# Patient Record
Sex: Male | Born: 1985 | Race: Black or African American | Hispanic: No | Marital: Single | Smoking: Current every day smoker
Health system: Southern US, Community
[De-identification: ages and names within clinical notes are randomized; demographics above are authoritative.]

## PROBLEM LIST (undated history)

## (undated) HISTORY — PX: LEG SURGERY: SHX1003

## (undated) HISTORY — PX: FINGER SURGERY: SHX640

---

## 2017-02-22 ENCOUNTER — Encounter (HOSPITAL_COMMUNITY): Payer: Self-pay | Admitting: Emergency Medicine

## 2017-02-22 ENCOUNTER — Emergency Department (HOSPITAL_COMMUNITY): Payer: BLUE CROSS/BLUE SHIELD

## 2017-02-22 ENCOUNTER — Emergency Department (HOSPITAL_COMMUNITY)
Admission: EM | Admit: 2017-02-22 | Discharge: 2017-02-22 | Disposition: A | Discharge status: Home / Self Care | Payer: BLUE CROSS/BLUE SHIELD | Attending: Emergency Medicine | Admitting: Emergency Medicine

## 2017-02-22 ENCOUNTER — Other Ambulatory Visit: Payer: Self-pay

## 2017-02-22 DIAGNOSIS — Y929 Unspecified place or not applicable: Secondary | ICD-10-CM | POA: Insufficient documentation

## 2017-02-22 DIAGNOSIS — S62306A Unspecified fracture of fifth metacarpal bone, right hand, initial encounter for closed fracture: Secondary | ICD-10-CM | POA: Insufficient documentation

## 2017-02-22 DIAGNOSIS — Y999 Unspecified external cause status: Secondary | ICD-10-CM | POA: Diagnosis not present

## 2017-02-22 DIAGNOSIS — Y9301 Activity, walking, marching and hiking: Secondary | ICD-10-CM | POA: Diagnosis not present

## 2017-02-22 DIAGNOSIS — S6991XA Unspecified injury of right wrist, hand and finger(s), initial encounter: Secondary | ICD-10-CM | POA: Diagnosis present

## 2017-02-22 DIAGNOSIS — W000XXA Fall on same level due to ice and snow, initial encounter: Secondary | ICD-10-CM | POA: Diagnosis not present

## 2017-02-22 DIAGNOSIS — S62396A Other fracture of fifth metacarpal bone, right hand, initial encounter for closed fracture: Secondary | ICD-10-CM

## 2017-02-22 NOTE — Discharge Instructions (Signed)
Please see the information and instructions below regarding your visit.  Your diagnoses today include:  1. Other fracture of fifth metacarpal bone, right hand, initial encounter for closed fracture    You have a fracture in the bone just below your pinky finger.  This requires follow-up with a hand surgeon to evaluate you.  I spoke with Dr. Eulah PontMurphy today over the phone.  He would like to see you in the office this week.  Please keep your splint on until you see him.  Tests performed today include: See side panel of your discharge paperwork for testing performed today. Vital signs are listed at the bottom of these instructions.   X-rays of the hand.  Medications prescribed:    Take any prescribed medications only as prescribed, and any over the counter medications only as directed on the packaging.  Please alternate ibuprofen and Tylenol for the pain.  He may take ibuprofen every 6 hours and Tylenol every 6 hours.  Do not exceed 4 g of Tylenol 1 day.  Home care instructions:  Please follow any educational materials contained in this packet.   Keep the splint dry and covered if you are to shower.  Follow-up instructions:  Please follow up with Dr. Eulah PontMurphy in hand surgery for your fracture.  Return instructions:   Please return to the Emergency Department if you experience worsening symptoms.   Please return if you notice your fingers going numb or pale at the end of the splint, or you have an increase in swelling or pain.  Please return if you have any other emergent concerns.  Additional Information:   Your vital signs today were: BP (!) 148/89    Pulse 78    Temp 99 F (37.2 C)    Resp 18    SpO2 100%  If your blood pressure (BP) was elevated on multiple readings during this visit above 130 for the top number or above 80 for the bottom number, please have this repeated by your primary care provider within one month.  Your blood pressure is high today.  I would like you to  find a primary care provider regarding this.  It should be rechecked. --------------  Thank you for allowing us to participate in your care today.

## 2017-02-22 NOTE — ED Provider Notes (Signed)
[facility] Provider Note   CSN: [id number] Arrival date & time: 02/22/17  1057     History   Chief Complaint Chief Complaint  Patient presents with  . Hand Injury    HPI Daniel ButtonJaspen Herandez is a 31 y.o. male.  HPI   Patient is a 31 year old male with no significant past medical history presenting for a right hand injury that occurred at 1:30 AM last night.  Patient reports that he was leaving a Christmas party and slipped on ice.  Patient caught himself with his right hand.  Patient did not lose consciousness.  There is no head trauma or other associated injury with this event.  Patient reports that he noted immediate swelling, however there was no loss of sensation or weakness of the right hand.  No wound occurred in this process.  History reviewed. No pertinent past medical history.  There are no active problems to display for this patient.   History reviewed. No pertinent surgical history.     Home Medications    Prior to Admission medications   Not on File    Family History No family history on file.  Social History Social History   Tobacco Use  . Smoking status: Not on file  Substance Use Topics  . Alcohol use: Not on file  . Drug use: Not on file     Allergies   Patient has no known allergies.   Review of Systems Review of Systems  Musculoskeletal: Positive for joint swelling.  Skin: Negative for color change and wound.  Neurological: Negative for weakness and numbness.     Physical Exam Updated Vital Signs BP (!) 148/89   Pulse 78   Temp 99 F (37.2 C)   Resp 18   SpO2 100%   Physical Exam  Constitutional: He appears well-developed and well-nourished. No distress.  Sitting comfortably in bed.  HENT:  Head: Normocephalic and atraumatic.  Eyes: Conjunctivae are normal. Right eye exhibits no discharge. Left eye exhibits no discharge.  EOMs normal to gross examination.  Neck: Normal range of motion.    Cardiovascular: Normal rate and regular rhythm.  Intact, 2+ radial and ulnar pulse of the right wrist.  Pulmonary/Chest:  Normal respiratory effort. Patient converses comfortably. No audible wheeze or stridor.  Abdominal: He exhibits no distension.  Musculoskeletal: Normal range of motion.  Hand Exam:  Inspection: There is mild swelling without ecchymosis to the lateral aspect of the right fifth metacarpal.   Palpation: No point tenderness, crepitus, tenderness over anatomic snuffbox, or scapholunate joint tenderness. ROM: There is a rotational deformity noted of the right fifth digit. Ligamentous stability: No laxity to valgus/varus stress of MCP, PIP, or DIP joints. No joint laxity with radial stress of thumb. Flexor/Extensor tendons: FDS/FDP tendons intact in digits 2-5 at PIP/DIP joints, respectively; extensor tendons intact in all digits Nerve testing:  Sensation to sharp and dull is intact of the distal tip of the right fifth digit.   Vascular: 2+ radial and ulnar pulses. Capillary refill <2 seconds b/l.  Neurological: He is alert.  Cranial nerves intact to gross observation. Patient moves extremities without difficulty.  Skin: Skin is warm and dry. He is not diaphoretic.  Psychiatric: He has a normal mood and affect. His behavior is normal. Judgment and thought content normal.  Nursing note and vitals reviewed.    ED Treatments / Results  Labs (all labs ordered are listed, but only abnormal results are displayed) Labs Reviewed - No data  to display  EKG  EKG Interpretation None       Radiology Dg Hand Complete Right  Result Date: 02/22/2017 CLINICAL DATA:  Slip and fall with fifth digit pain, initial encounter EXAM: RIGHT HAND - COMPLETE 3+ VIEW COMPARISON:  Fifth digit film from earlier in the same day. FINDINGS: Angulated distal fifth metacarpal fracture is noted. This is stable in appearance from the prior exam. No other focal abnormality is noted. IMPRESSION: Fifth  metacarpal fracture stable from previous images. Electronically Signed   By: Alcide CleverMark  Lukens M.D.   On: 02/22/2017 13:39   Dg Finger Little Right  Result Date: 02/22/2017 CLINICAL DATA:  31 year old male status post slip and fall on ice. Right fifth finger pain. EXAM: RIGHT LITTLE FINGER 2+V COMPARISON:  None. FINDINGS: Mildly comminuted fracture of the distal aspect of the right fifth metacarpal with volar angulation and impaction. Associated ulnar aspect right hand soft tissue swelling. Fifth MCP joint space is preserved. The fifth phalanges appear intact. Bone mineralization is within normal limits. Other visible osseous structures intact. IMPRESSION: Fracture of the distal right fifth metacarpal with volar angulation. Electronically Signed   By: Odessa FlemingH  Hall M.D.   On: 02/22/2017 12:18    Procedures Procedures (including critical care time)  Medications Ordered in ED Medications - No data to display   Initial Impression / Assessment and Plan / ED Course  I have reviewed the triage vital signs and the nursing notes.  Pertinent labs & imaging results that were available during my care of the patient were reviewed by me and considered in my medical decision making (see chart for details).      Final Clinical Impressions(s) / ED Diagnoses   Final diagnoses:  Other fracture of fifth metacarpal bone, right hand, initial encounter for closed fracture   On right fifth finger x-ray, patient exhibits an angulated fifth metacarpal fracture.  Will obtain hand x-ray to assess the angulation at which this is occurring.  I discussed with patient that this will require follow-up with a hand surgeon.  Spoke with Dr. Renaye Rakersim Murphy of orthopedic surgery regarding patient's right fifth metacarpal fracture.  There is less than 40 degrees of angulation.  Patient is stable for splinting and will follow up with Dr. Eulah PontMurphy this week.  Return precautions given to patient for any pallor, paresthesias, or increasing  swelling of the right hand.  I discussed splint care with the patient.  Patient is in understanding and agrees with the plan of care.  ED Discharge Orders    None       Delia ChimesMurray, Tristan Bramble B, PA-C 02/22/17 1450    Margarita Grizzleay, Danielle, MD 02/22/17 [phone]

## 2017-02-22 NOTE — ED Notes (Signed)
Pt in xray

## 2017-02-22 NOTE — ED Triage Notes (Signed)
Pt states he slipped on ice and injured his right pinky finger. No open wounds noted. Still able to move his pinky.

## 2017-02-22 NOTE — Progress Notes (Signed)
Orthopedic Tech Progress Note Patient Details:  Daniel ButtonJaspen Pope 29-Mar-1985 [id number]  Ortho Devices Type of Ortho Device: Ace wrap, Ulna gutter splint, Arm sling Ortho Device/Splint Interventions: Application   Post Interventions Patient Tolerated: Well Instructions Provided: Care of device   Saul FordyceJennifer C Lilias Lorensen 02/22/2017, 2:26 PM

## 2018-04-17 IMAGING — DX DG FINGER LITTLE 2+V*R*
3 series · 3 of 3 positions shown · non-contrast
Comparison: None.

CLINICAL DATA: 31-year-old male status post slip and fall on ice.
Right fifth finger pain.

EXAM:
RIGHT LITTLE FINGER 2+V

[finger ap]
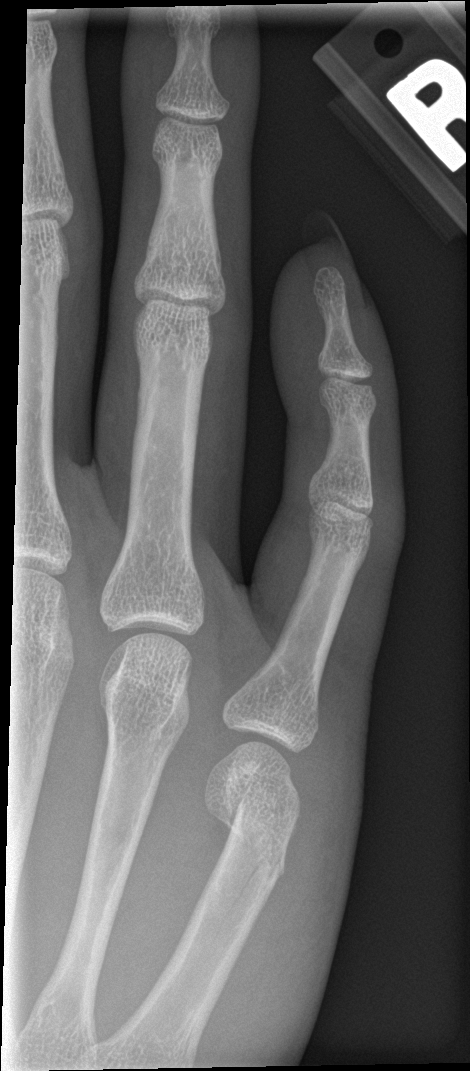

[finger obl]
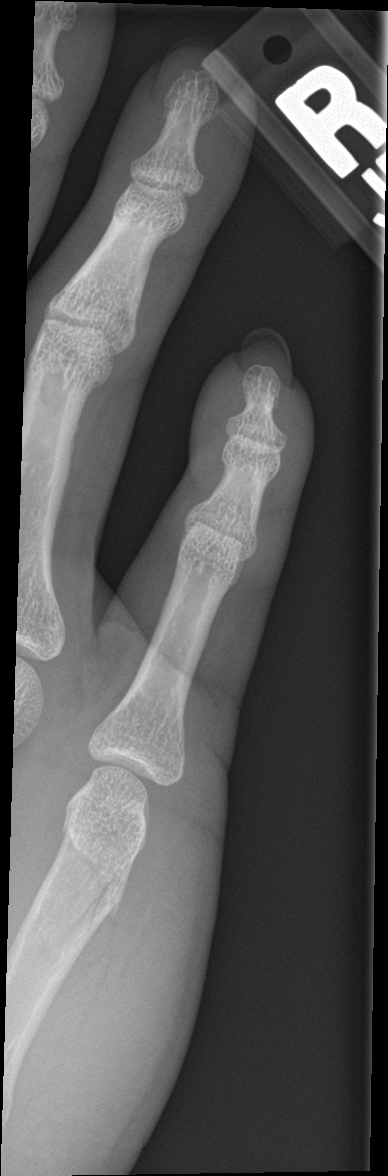

[finger lat]
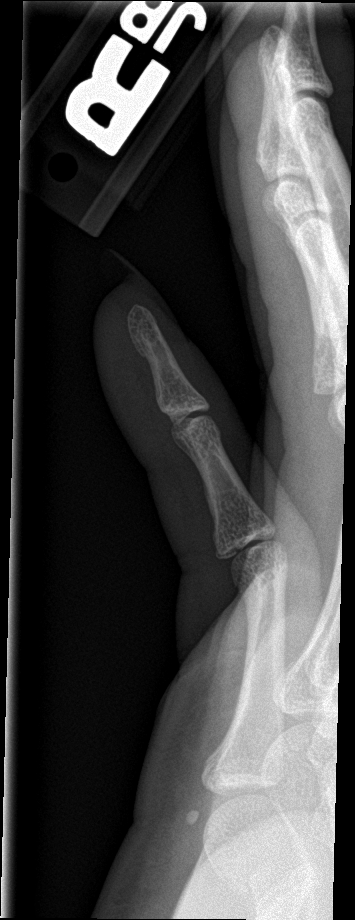

[3 of 3 positions shown; findings below may reference images not displayed]

FINDINGS: Mildly comminuted fracture of the distal aspect of the right fifth
metacarpal with volar angulation and impaction. Associated ulnar
aspect right hand soft tissue swelling. Fifth MCP joint space is
preserved. The fifth phalanges appear intact. Bone mineralization is
within normal limits. Other visible osseous structures intact.
IMPRESSION: Fracture of the distal right fifth metacarpal with volar angulation.

## 2018-11-08 ENCOUNTER — Ambulatory Visit: Payer: Self-pay

## 2018-11-17 ENCOUNTER — Encounter: Payer: Self-pay | Admitting: Internal Medicine

## 2018-11-17 DIAGNOSIS — E669 Obesity, unspecified: Secondary | ICD-10-CM | POA: Insufficient documentation

## 2018-11-17 DIAGNOSIS — E785 Hyperlipidemia, unspecified: Secondary | ICD-10-CM | POA: Insufficient documentation

## 2020-02-06 ENCOUNTER — Encounter (HOSPITAL_COMMUNITY): Payer: Self-pay | Admitting: Emergency Medicine

## 2020-02-06 ENCOUNTER — Emergency Department (HOSPITAL_COMMUNITY)
Admission: EM | Admit: 2020-02-06 | Discharge: 2020-02-07 | Disposition: A | Discharge status: Home / Self Care | Payer: Self-pay | Attending: Emergency Medicine | Admitting: Emergency Medicine

## 2020-02-06 ENCOUNTER — Other Ambulatory Visit: Payer: Self-pay

## 2020-02-06 DIAGNOSIS — I10 Essential (primary) hypertension: Secondary | ICD-10-CM | POA: Insufficient documentation

## 2020-02-06 DIAGNOSIS — R040 Epistaxis: Secondary | ICD-10-CM | POA: Insufficient documentation

## 2020-02-06 NOTE — ED Triage Notes (Signed)
Pt states he had 2 episode of nose bleed today, denies any blood thinner. No nose bleed now.

## 2020-02-07 NOTE — ED Provider Notes (Signed)
[facility] EMERGENCY DEPARTMENT Provider Note   CSN: [id number] Arrival date & time: 02/06/20  2019     History Chief Complaint  Patient presents with   Epistaxis    Daniel Pope is a 34 y.o. male presenting to the emergency department with complaint of 2 episodes of epistaxis.  Patient states early yesterday morning he had a nosebleed which resolved and then recurred.  It has since resolved as well without intervention.  It was only bleeding from his left nostril.  Upon arrival to the ED last night he had elevated blood pressure to 186/116.  He denies history of the same.  He states he last checked his blood pressure about 6 months ago when he had physical exam for work.  He states yesterday he had a mild headache as well which resolved.  No chest pain, shortness of breath, vision changes, numbness or weakness.  He endorses seasonal allergies. Not on anticoagulation.  No daily medications  The history is provided by the patient.       History reviewed. No pertinent past medical history.  Patient Active Problem List   Diagnosis Date Noted   Elevated lipids 11/17/2018   Obesity 11/17/2018    History reviewed. No pertinent surgical history.     No family history on file.  Social History   Tobacco Use   Smoking status: Not on file  Substance Use Topics   Alcohol use: Not on file   Drug use: Not on file    Home Medications Prior to Admission medications   Not on File    Allergies    Patient has no known allergies.  Review of Systems   Review of Systems  HENT: Positive for nosebleeds.   All other systems reviewed and are negative.   Physical Exam Updated Vital Signs BP (!) 155/90    Pulse (!) 58    Temp 99.1 F (37.3 C) (Oral)    Resp 17    Ht 5\' 10"  (1.778 m)    Wt 124.7 kg    SpO2 99%    BMI 39.46 kg/m   Physical Exam Vitals and nursing note reviewed.  Constitutional:      General: He is not in acute distress.    Appearance:  He is well-developed. He is obese.  HENT:     Head: Normocephalic and atraumatic.     Nose:     Comments: Mildly edematous and erythematous turbinates bilaterally.  No bleeding from the septum noted. Eyes:     Extraocular Movements: Extraocular movements intact.     Conjunctiva/sclera: Conjunctivae normal.     Pupils: Pupils are equal, round, and reactive to light.  Cardiovascular:     Rate and Rhythm: Normal rate and regular rhythm.  Pulmonary:     Effort: Pulmonary effort is normal. No respiratory distress.     Breath sounds: Normal breath sounds.  Abdominal:     Palpations: Abdomen is soft.  Skin:    General: Skin is warm.  Neurological:     Mental Status: He is alert and oriented to person, place, and time.     Comments: Mental Status:  Alert, oriented, thought content appropriate, able to give a coherent history. Speech fluent without evidence of aphasia. Able to follow 2 step commands without difficulty.  Cranial Nerves grossly normal. Motor:  Normal tone. 5/5 strength in upper and lower extremities bilaterally  Cerebellar: normal finger-to-nose with bilateral upper extremities Gait: normal gait and balance CV: distal pulses palpable throughout  Psychiatric:        Behavior: Behavior normal.     ED Results / Procedures / Treatments   Labs (all labs ordered are listed, but only abnormal results are displayed) Labs Reviewed - No data to display  EKG None  Radiology No results found.  Procedures Procedures (including critical care time)  Medications Ordered in ED Medications - No data to display  ED Course  I have reviewed the triage vital signs and the nursing notes.  Pertinent labs & imaging results that were available during my care of the patient were reviewed by me and considered in my medical decision making (see chart for details).    MDM Rules/Calculators/A&P                          Patient presenting for 2 episodes of epistaxis that occurred  yesterday.  Suspect this is multifactorial with the cold weather, seasonal allergies, and hypertension.  He has no known history of hypertension.  Blood pressure has improved while in the ED today without intervention.  No other concerning symptoms noted.  He is well-appearing and in no distress.  No focal neuro deficits.  No active bleeding from the nares or septum.  Recommend saline nasal sprays to keep mucosa moist.  Afrin as needed for nosebleeds and direct pressure.  He is instructed to limit salt intake to help high blood pressure, daily blood pressure log and follow closely with PCP.  Return precautions discussed.  Discussed results, findings, treatment and follow up. Patient advised of return precautions. Patient verbalized understanding and agreed with plan.  Final Clinical Impression(s) / ED Diagnoses Final diagnoses:  Left-sided epistaxis  Hypertension, unspecified type    Rx / DC Orders ED Discharge Orders    None       Damere Brandenburg, [place] N, PA-C 02/07/20 [id number]    Alvira Monday, MD 02/10/20 0100

## 2020-02-07 NOTE — ED Notes (Signed)
Patient verbalizes understanding of discharge instructions. Opportunity for questioning and answers were provided. Arm band removed by staff, patient discharged from ED. 

## 2020-02-07 NOTE — Discharge Instructions (Signed)
Use saline nasal sprays to keep your nose moist and help present bleeds. If you do experience another nose bleed, use Afrin nasal spray and apply direct pressure to your nasal septum. If bleeding persists, you can report back to the ED for evaluation. It is recommended you keep a daily blood pressure log and follow closely with primary care for evaluation of your high blood pressure today.

## 2022-03-25 ENCOUNTER — Ambulatory Visit (HOSPITAL_COMMUNITY)
Admission: EM | Admit: 2022-03-25 | Discharge: 2022-03-25 | Disposition: A | Discharge status: Home / Self Care | Payer: Self-pay | Attending: Emergency Medicine | Admitting: Emergency Medicine

## 2022-03-25 ENCOUNTER — Encounter (HOSPITAL_COMMUNITY): Payer: Self-pay | Admitting: Emergency Medicine

## 2022-03-25 DIAGNOSIS — J019 Acute sinusitis, unspecified: Secondary | ICD-10-CM

## 2022-03-25 DIAGNOSIS — R051 Acute cough: Secondary | ICD-10-CM

## 2022-03-25 DIAGNOSIS — R030 Elevated blood-pressure reading, without diagnosis of hypertension: Secondary | ICD-10-CM

## 2022-03-25 MED ORDER — AZITHROMYCIN 250 MG PO TABS: 250.0000 mg | ORAL_TABLET | Freq: Every day | ORAL | 0 refills | Status: AC

## 2022-03-25 NOTE — Discharge Instructions (Signed)
Azithromycin was sent to the pharmacy, you will take 2 tablets today and 1 tablet each day until day 5.  You may continue the over-the-counter DayQuil and NyQuil for symptom management.   If your cough symptoms worsen, as discussed, he will need to present for follow-up.   You will need to establish care with a primary care doctor, as discussed, you may go to [email or web address], then click on primary care, and establish appointment that works well with your schedule.   I think it is important for you to begin checking your blood pressures at home, your blood pressure should be below 140/90, normal blood pressures usually range around 120/80, please begin keeping a record of your blood pressure and schedule follow-up with the PCP.

## 2022-03-25 NOTE — ED Provider Notes (Signed)
[facility]    CSN: [id number] Arrival date & time: 03/25/22  1006      History   Chief Complaint Chief Complaint  Patient presents with   Cough   Nasal Congestion    HPI Daniel Pope is a 37 y.o. male.  Patient presents complaining of nasal congestion and productive cough that has been ongoing for the past 15 days.  Patient reports onset of symptoms began with generalized body aches , sore throat, fatigue, and feeling as though he caught a viral illness, he reports that the initial symptoms have now resolved.  He reports nasal congestion continues and has not improved.  He denies any shortness of breath, wheezing, or chest pain.  He denies any fever or chills.  He has taken Flonase with minimal relief of symptoms, he states that he did not like using the nasal spray due to nasal dryness.  Patient has used DayQuil and NyQuil with some relief of symptoms in regard to his cough.  Patient reports a history of having high blood pressure readings.  He reports that he has not establish care with a primary care doctor.  He reports that his mother and sister were diagnosed with hypertension.  He reports that he is resistant to wanting to start any blood pressure medications.  He reports that he did smoke a cigarette before arrival to clinic.    Cough Associated symptoms: rhinorrhea   Associated symptoms: no chest pain, no chills, no fever, no shortness of breath, no sore throat and no wheezing     History reviewed. No pertinent past medical history.  Patient Active Problem List   Diagnosis Date Noted   Elevated lipids 11/17/2018   Obesity 11/17/2018    Past Surgical History:  Procedure Laterality Date   FINGER SURGERY     LEG SURGERY         Home Medications    Prior to Admission medications   Medication Sig Start Date End Date Taking? Authorizing Provider  azithromycin (ZITHROMAX) 250 MG tablet Take 1 tablet (250 mg total) by mouth daily. Take first 2 tablets  together, then 1 every day until finished. 03/25/22  Yes Flossie Dibble, NP    Family History History reviewed. No pertinent family history.  Social History Social History   Tobacco Use   Smoking status: Every Day    Types: Cigarettes  Vaping Use   Vaping Use: Never used     Allergies   Patient has no known allergies.   Review of Systems Review of Systems  Constitutional:  Negative for activity change, appetite change, chills, fatigue and fever.  HENT:  Positive for congestion, rhinorrhea and sinus pressure. Negative for dental problem, postnasal drip, sinus pain, sore throat and trouble swallowing.   Eyes: Negative.   Respiratory:  Positive for cough. Negative for chest tightness, shortness of breath and wheezing.   Cardiovascular:  Negative for chest pain, palpitations and leg swelling.  Gastrointestinal: Negative.      Physical Exam Triage Vital Signs ED Triage Vitals  Enc Vitals Group     BP 03/25/22 1033 (!) 171/108     Pulse Rate 03/25/22 1033 79     Resp 03/25/22 1033 16     Temp 03/25/22 1033 98.5 F (36.9 C)     Temp Source 03/25/22 1033 Oral     SpO2 03/25/22 1033 97 %     Weight --      Height --      Head Circumference --  Peak Flow --      Pain Score 03/25/22 1035 0     Pain Loc --      Pain Edu? --      Excl. in [place]? --    No data found.  Updated Vital Signs BP (!) 167/122 (BP Location: Left Arm)   Pulse 79   Temp 98.5 F (36.9 C) (Oral)   Resp 16   SpO2 97%      Physical Exam Vitals and nursing note reviewed.  Constitutional:      Appearance: Normal appearance.  HENT:     Right Ear: Hearing, tympanic membrane, ear canal and external ear normal.     Left Ear: Hearing, tympanic membrane, ear canal and external ear normal.     Nose: Congestion and rhinorrhea present. Rhinorrhea is clear.     Right Turbinates: Enlarged and swollen. Not pale.     Left Turbinates: Enlarged and swollen. Not pale.     Right Sinus: No maxillary  sinus tenderness.     Left Sinus: No maxillary sinus tenderness or frontal sinus tenderness.     Mouth/Throat:     Mouth: Mucous membranes are moist.     Pharynx: Oropharynx is clear. Uvula midline. No pharyngeal swelling, oropharyngeal exudate, posterior oropharyngeal erythema or uvula swelling.     Tonsils: 0 on the right. 0 on the left.  Cardiovascular:     Rate and Rhythm: Normal rate and regular rhythm.     Heart sounds: Normal heart sounds, S1 normal and S2 normal.  Pulmonary:     Effort: Pulmonary effort is normal.     Breath sounds: Normal breath sounds and air entry. No decreased breath sounds, wheezing, rhonchi or rales.  Neurological:     Mental Status: He is alert.      UC Treatments / Results  Labs (all labs ordered are listed, but only abnormal results are displayed) Labs Reviewed - No data to display  EKG   Radiology No results found.  Procedures Procedures (including critical care time)  Medications Ordered in UC Medications - No data to display  Initial Impression / Assessment and Plan / UC Course  I have reviewed the triage vital signs and the nursing notes.  Pertinent labs & imaging results that were available during my care of the patient were reviewed by me and considered in my medical decision making (see chart for details).     Patient was evaluated for acute cough and sinusitis.  Initial etiology appears to be related to a viral illness, shared decision-making was used to determine to provide antimicrobial coverage due to the possible development of a secondary bacterial etiology, patient wanted to hold off on any chest x-ray at this time.  Azithromycin prescription was prescribed, patient was made aware of treatment regiment.  Patient was made aware that if his cough worsens he will need to present to clinic for follow-up and receive a chest x-ray at that time.  Patient was made aware he can continue with the over-the-counter cough suppressant since  it was providing him with relief of cough.    Patient was educated on his blood pressure.  He reported that he has had knowledge of his blood pressure being elevated for "a while now".  He reports that he really does not want to start antiantihypertensive medications.  Patient was made aware that he will need to start low-sodium diet and exercise.  He was also made aware if possible he needs to start tobacco cessation.  He was educated on the long-term effects of hypertension.  He was made aware that he should begin checking his blood pressures at home and he needs to establish care with a PCP.  He wanted to receive information so that he could schedule a primary care visit on his own time, this Clinical research associate provided him with the information and put a notification in the system.  Patient verbalized understanding of instructions.  Charting was provided using a a verbal dictation system, charting was proofread for errors, errors may occur which could change the meaning of the information charted.   Final Clinical Impressions(s) / UC Diagnoses   Final diagnoses:  Acute non-recurrent sinusitis, unspecified location  Acute cough     Discharge Instructions      Azithromycin was sent to the pharmacy, you will take 2 tablets today and 1 tablet each day until day 5.  You may continue the over-the-counter DayQuil and NyQuil for symptom management.   If your cough symptoms worsen, as discussed, he will need to present for follow-up.   You will need to establish care with a primary care doctor, as discussed, you may go to [email or web address], then click on primary care, and establish appointment that works well with your schedule.   I think it is important for you to begin checking your blood pressures at home, your blood pressure should be below 140/90, normal blood pressures usually range around 120/80, please begin keeping a record of your blood pressure and schedule follow-up with the PCP.      ED  Prescriptions     Medication Sig Dispense Auth. Provider   azithromycin (ZITHROMAX) 250 MG tablet Take 1 tablet (250 mg total) by mouth daily. Take first 2 tablets together, then 1 every day until finished. 6 tablet Debby Freiberg, NP      PDMP not reviewed this encounter.   Debby Freiberg, NP 03/25/22 1134

## 2022-03-25 NOTE — ED Triage Notes (Signed)
Cough, nasal congestion, sore throat, generalized body aches since 03/10/2022. Denies fever, chest pain, SOB, wheezing, palpitations, abdominal pain, N/V/D, headache. Treating at home with Mucinex, Dayquil, and nyquil with minimal symptom relief.  Cough and nasal congestion are primary symptoms at this time, denies current pain.

## 2023-03-08 ENCOUNTER — Encounter (HOSPITAL_COMMUNITY): Payer: Self-pay

## 2023-03-08 ENCOUNTER — Ambulatory Visit (HOSPITAL_COMMUNITY)
Admission: EM | Admit: 2023-03-08 | Discharge: 2023-03-08 | Disposition: A | Discharge status: Home / Self Care | Payer: Self-pay | Attending: Family Medicine | Admitting: Family Medicine

## 2023-03-08 DIAGNOSIS — J028 Acute pharyngitis due to other specified organisms: Secondary | ICD-10-CM

## 2023-03-08 DIAGNOSIS — J039 Acute tonsillitis, unspecified: Secondary | ICD-10-CM

## 2023-03-08 LAB — POCT INFLUENZA A/B
Influenza A, POC: NEGATIVE
Influenza B, POC: NEGATIVE

## 2023-03-08 MED ORDER — AMOXICILLIN 500 MG PO CAPS: 500.0000 mg | ORAL_CAPSULE | Freq: Two times a day (BID) | ORAL | 0 refills | Status: AC

## 2023-03-08 MED ORDER — PREDNISONE 10 MG PO TABS: 40.0000 mg | ORAL_TABLET | Freq: Every day | ORAL | 0 refills | Status: AC

## 2023-03-08 MED ORDER — PROMETHAZINE-DM 6.25-15 MG/5ML PO SYRP: 5.0000 mL | ORAL_SOLUTION | Freq: Three times a day (TID) | ORAL | 0 refills | Status: AC | PRN

## 2023-03-08 NOTE — Discharge Instructions (Addendum)
Flu test was negative. Symptoms most consistent with Acute pharyngitis and tonsillitis. Will treat with the following:  Amoxicillin 500 mg twice daily for 10 days.  Prednisone 40 mg daily for 5 days. Take this in the morning.  Promethazine DM 5 mL every 8 hours as needed for cough.  Use caution as this medication can cause drowsiness.  Referral placed for a primary care provider. The office will contact you to help set this up Contact ENT provider to schedule an appointment for recurrent tonsil infections Return to urgent care or PCP if symptoms worsen or fail to resolve.

## 2023-03-08 NOTE — ED Triage Notes (Signed)
Pt presents with cough and loss of voice x 1 day. Pt currently denies pain. Pt reports taking Dayquil + Nyquil yesterday with little improvement. Pt denies fevers at home. Pt states his father and daughter tested + for Flu recently. Pt is requesting a Flu test.

## 2023-03-08 NOTE — ED Provider Notes (Signed)
[facility]    CSN: [id number] Arrival date & time: 03/08/23  1326      History   Chief Complaint Chief Complaint  Patient presents with   Cough    HPI Mccarthy Goldwasser is a 37 y.o. male.   37 year old male who presents to urgent care with complaints of sore throat, cough, swollen tonsils, loss of voice.  This started about 1 to 2 days ago.  He reports that the cough is severe and his throat is very sore.  He denies fevers or chills, nausea or vomiting, abdominal pain or urinary symptoms.  He has had recurrent infections in his tonsils.  He also reports that his daughter and his father both have tested positive for flu and he would like to be tested for flu today.   Cough Associated symptoms: sore throat   Associated symptoms: no chest pain, no chills, no ear pain, no fever, no rash and no shortness of breath     History reviewed. No pertinent past medical history.  Patient Active Problem List   Diagnosis Date Noted   Elevated lipids 11/17/2018   Obesity 11/17/2018    Past Surgical History:  Procedure Laterality Date   FINGER SURGERY     LEG SURGERY         Home Medications    Prior to Admission medications   Medication Sig Start Date End Date Taking? Authorizing Provider  azithromycin (ZITHROMAX) 250 MG tablet Take 1 tablet (250 mg total) by mouth daily. Take first 2 tablets together, then 1 every day until finished. 03/25/22   Debby Freiberg, NP    Family History History reviewed. No pertinent family history.  Social History Social History   Tobacco Use   Smoking status: Every Day    Types: Cigarettes  Vaping Use   Vaping status: Never Used  Substance Use Topics   Alcohol use: Yes    Comment: occassional   Drug use: Not Currently     Allergies   Patient has no known allergies.   Review of Systems Review of Systems  Constitutional:  Negative for chills and fever.  HENT:  Positive for sore throat and voice change. Negative for  ear pain.   Eyes:  Negative for pain and visual disturbance.  Respiratory:  Positive for cough. Negative for shortness of breath.   Cardiovascular:  Negative for chest pain and palpitations.  Gastrointestinal:  Negative for abdominal pain and vomiting.  Genitourinary:  Negative for dysuria and hematuria.  Musculoskeletal:  Negative for arthralgias and back pain.  Skin:  Negative for color change and rash.  Neurological:  Negative for seizures and syncope.  All other systems reviewed and are negative.    Physical Exam Triage Vital Signs ED Triage Vitals  Encounter Vitals Group     BP 03/08/23 1454 (!) 162/115     Systolic BP Percentile --      Diastolic BP Percentile --      Pulse Rate 03/08/23 1454 71     Resp 03/08/23 1454 18     Temp 03/08/23 1454 98.5 F (36.9 C)     Temp Source 03/08/23 1454 Oral     SpO2 03/08/23 1454 98 %     Weight 03/08/23 1455 250 lb (113.4 kg)     Height 03/08/23 1455 5\' 10"  (1.778 m)     Head Circumference --      Peak Flow --      Pain Score 03/08/23 1455 0  Pain Loc --      Pain Education --      Exclude from Growth Chart --    No data found.  Updated Vital Signs BP (!) 162/115 (BP Location: Left Arm) Comment: second attempt, 167/133 on first attempt. Pt states his BP is typically high and he is not on medication for it.  Pulse 71   Temp 98.5 F (36.9 C) (Oral)   Resp 18   Ht 5\' 10"  (1.778 m)   Wt 250 lb (113.4 kg)   SpO2 98%   BMI 35.87 kg/m   Visual Acuity Right Eye Distance:   Left Eye Distance:   Bilateral Distance:    Right Eye Near:   Left Eye Near:    Bilateral Near:     Physical Exam Vitals and nursing note reviewed.  Constitutional:      General: He is not in acute distress.    Appearance: He is well-developed.  HENT:     Head: Normocephalic and atraumatic.  Eyes:     Conjunctiva/sclera: Conjunctivae normal.  Cardiovascular:     Rate and Rhythm: Normal rate and regular rhythm.     Heart sounds: No murmur  heard. Pulmonary:     Effort: Pulmonary effort is normal. No respiratory distress.     Breath sounds: Normal breath sounds.  Abdominal:     Palpations: Abdomen is soft.     Tenderness: There is no abdominal tenderness.  Musculoskeletal:        General: No swelling.     Cervical back: Neck supple.  Skin:    General: Skin is warm and dry.     Capillary Refill: Capillary refill takes less than 2 seconds.  Neurological:     Mental Status: He is alert.  Psychiatric:        Mood and Affect: Mood normal.      UC Treatments / Results  Labs (all labs ordered are listed, but only abnormal results are displayed) Labs Reviewed  POCT INFLUENZA A/B    EKG   Radiology No results found.  Procedures Procedures (including critical care time)  Medications Ordered in UC Medications - No data to display  Initial Impression / Assessment and Plan / UC Course  I have reviewed the triage vital signs and the nursing notes.  Pertinent labs & imaging results that were available during my care of the patient were reviewed by me and considered in my medical decision making (see chart for details).     Acute pharyngitis due to other specified organisms  Tonsillitis   Flu test was negative. Symptoms most consistent with Acute pharyngitis and tonsillitis. Will treat with the following:  Amoxicillin 500 mg twice daily for 10 days.  Prednisone 40 mg daily for 5 days. Take this in the morning.  Promethazine DM 5 mL every 8 hours as needed for cough.  Use caution as this medication can cause drowsiness.  Referral placed for a primary care provider. The office will contact you to help set this up Contact ENT provider to schedule an appointment for recurrent tonsil infections Return to urgent care or PCP if symptoms worsen or fail to resolve.    Final Clinical Impressions(s) / UC Diagnoses   Final diagnoses:  None   Discharge Instructions   None    ED Prescriptions   None    PDMP not  reviewed this encounter.   Landis Martins, [place] 03/08/23 1521

## 2024-01-06 ENCOUNTER — Other Ambulatory Visit: Payer: Self-pay

## 2024-01-06 NOTE — Progress Notes (Signed)
 Completed and cleared pre employment drug screen [employer] after consents.
